# Patient Record
Sex: Male | Born: 1990 | Race: Black or African American | Hispanic: No | Marital: Single | Smoking: Current every day smoker
Health system: Southern US, Community
[De-identification: ages and names within clinical notes are randomized; demographics above are authoritative.]

---
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
## 2012-08-27 ENCOUNTER — Emergency Department (INDEPENDENT_AMBULATORY_CARE_PROVIDER_SITE_OTHER)
Admission: EM | Admit: 2012-08-27 | Discharge: 2012-08-27 | Disposition: A | Discharge status: Home / Self Care | Payer: Self-pay | Source: Home / Self Care | Attending: Family Medicine | Admitting: Family Medicine

## 2012-08-27 ENCOUNTER — Emergency Department (INDEPENDENT_AMBULATORY_CARE_PROVIDER_SITE_OTHER): Payer: Self-pay

## 2012-08-27 ENCOUNTER — Encounter (HOSPITAL_COMMUNITY): Payer: Self-pay | Admitting: Emergency Medicine

## 2012-08-27 DIAGNOSIS — S022XXA Fracture of nasal bones, initial encounter for closed fracture: Secondary | ICD-10-CM
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
## 2013-02-04 ENCOUNTER — Ambulatory Visit: Payer: Self-pay | Admitting: Family Medicine

## 2013-02-04 VITALS — BP 118/62 | HR 75 | Temp 98.2°F | Resp 16 | Ht 74.0 in | Wt 188.0 lb

## 2013-02-04 DIAGNOSIS — M549 Dorsalgia, unspecified: Secondary | ICD-10-CM

## 2013-02-04 MED ORDER — PREDNISONE 20 MG PO TABS: ORAL_TABLET | ORAL | Status: DC
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
## 2013-02-06 ENCOUNTER — Telehealth: Payer: Self-pay

## 2013-02-06 DIAGNOSIS — M549 Dorsalgia, unspecified: Secondary | ICD-10-CM

## 2013-02-06 MED ORDER — PREDNISONE 20 MG PO TABS: ORAL_TABLET | ORAL | Status: DC

## 2013-02-06 NOTE — Telephone Encounter (Signed)
Dr. Lauenstein? 
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
## 2013-02-06 NOTE — Telephone Encounter (Signed)
I have refilled the deltasone Rx as requested

## 2013-02-06 NOTE — Telephone Encounter (Signed)
Spoke with pt, advised Rx sent to pharmacy. 

## 2014-05-29 ENCOUNTER — Encounter (HOSPITAL_COMMUNITY): Payer: Self-pay | Admitting: Emergency Medicine

## 2014-05-29 ENCOUNTER — Emergency Department (HOSPITAL_COMMUNITY)
Admission: EM | Admit: 2014-05-29 | Discharge: 2014-05-29 | Discharge status: Against Medical Advice | Payer: Self-pay | Attending: Emergency Medicine | Admitting: Emergency Medicine

## 2014-05-29 DIAGNOSIS — Y999 Unspecified external cause status: Secondary | ICD-10-CM | POA: Insufficient documentation

## 2014-05-29 DIAGNOSIS — S0990XA Unspecified injury of head, initial encounter: Secondary | ICD-10-CM | POA: Insufficient documentation

## 2014-05-29 DIAGNOSIS — S50311A Abrasion of right elbow, initial encounter: Secondary | ICD-10-CM | POA: Insufficient documentation

## 2014-05-29 DIAGNOSIS — Y929 Unspecified place or not applicable: Secondary | ICD-10-CM | POA: Insufficient documentation

## 2014-05-29 DIAGNOSIS — Y939 Activity, unspecified: Secondary | ICD-10-CM | POA: Insufficient documentation

## 2014-05-29 DIAGNOSIS — S0501XA Injury of conjunctiva and corneal abrasion without foreign body, right eye, initial encounter: Secondary | ICD-10-CM | POA: Insufficient documentation

## 2014-05-29 DIAGNOSIS — Z720 Tobacco use: Secondary | ICD-10-CM | POA: Insufficient documentation

## 2014-05-29 DIAGNOSIS — S40211A Abrasion of right shoulder, initial encounter: Secondary | ICD-10-CM | POA: Insufficient documentation

## 2014-05-29 DIAGNOSIS — R42 Dizziness and giddiness: Secondary | ICD-10-CM | POA: Insufficient documentation

## 2014-05-29 NOTE — ED Notes (Signed)
No answer x3
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
## 2014-05-29 NOTE — ED Notes (Signed)
Call for VS reassessment with no answer.

## 2015-08-27 ENCOUNTER — Ambulatory Visit (INDEPENDENT_AMBULATORY_CARE_PROVIDER_SITE_OTHER): Payer: BC Managed Care – PPO | Admitting: Family Medicine

## 2015-08-27 VITALS — BP 122/70 | HR 92 | Temp 98.6°F | Resp 18 | Ht 74.25 in | Wt 190.6 lb

## 2015-08-27 DIAGNOSIS — J029 Acute pharyngitis, unspecified: Secondary | ICD-10-CM | POA: Diagnosis not present

## 2015-08-27 DIAGNOSIS — D72829 Elevated white blood cell count, unspecified: Secondary | ICD-10-CM

## 2015-08-27 DIAGNOSIS — R1112 Projectile vomiting: Secondary | ICD-10-CM | POA: Diagnosis not present

## 2015-08-27 DIAGNOSIS — R6883 Chills (without fever): Secondary | ICD-10-CM

## 2015-08-27 LAB — POCT CBC
GRANULOCYTE PERCENT: 85.6 % — AB (ref 37–80)
HEMATOCRIT: 40.6 % — AB (ref 43.5–53.7)
Hemoglobin: 14.3 g/dL (ref 14.1–18.1)
LYMPH, POC: 1.3 (ref 0.6–3.4)
MCH, POC: 32.8 pg — AB (ref 27–31.2)
MCHC: 35.2 g/dL (ref 31.8–35.4)
MCV: 93.4 fL (ref 80–97)
MID (CBC): 0.6 (ref 0–0.9)
MPV: 7.2 fL (ref 0–99.8)
POC GRANULOCYTE: 11.5 — AB (ref 2–6.9)
POC LYMPH %: 9.9 % — AB (ref 10–50)
POC MID %: 4.5 %M (ref 0–12)
Platelet Count, POC: 190 10*3/uL (ref 142–424)
RBC: 4.35 M/uL — AB (ref 4.69–6.13)
RDW, POC: 13.1 %
WBC: 13.4 10*3/uL — AB (ref 4.6–10.2)

## 2015-08-27 LAB — POCT RAPID STREP A (OFFICE): Rapid Strep A Screen: NEGATIVE

## 2015-08-27 MED ORDER — ONDANSETRON 4 MG PO TBDP
8.0000 mg | ORAL_TABLET | Freq: Once | ORAL | Status: AC
  Administered 2015-08-27: 8 mg via ORAL

## 2015-08-27 MED ORDER — DOXYCYCLINE HYCLATE 100 MG PO CAPS: 100.0000 mg | ORAL_CAPSULE | Freq: Two times a day (BID) | ORAL | 0 refills | Status: DC

## 2015-08-27 MED ORDER — CEFTRIAXONE SODIUM 250 MG IJ SOLR
250.0000 mg | Freq: Once | INTRAMUSCULAR | Status: AC
  Administered 2015-08-27: 250 mg via INTRAMUSCULAR

## 2015-08-27 MED ORDER — ONDANSETRON HCL 4 MG/2ML IJ SOLN: 2.0000 mg | Freq: Once | INTRAMUSCULAR | Status: DC

## 2015-08-27 MED ORDER — ONDANSETRON 8 MG PO TBDP: 8.0000 mg | ORAL_TABLET | Freq: Three times a day (TID) | ORAL | 0 refills | Status: DC | PRN

## 2015-08-27 NOTE — Patient Instructions (Addendum)
Please return to the office for follow-up tomorrow!   Start Doxycyline today and complete as directed.  If you begin to spike a fever>101.5 or condition worsens, return for care or go to the emergency department.  I will follow-up with your thoart culture.    IF you received an x-ray today, you will receive an invoice from [facility]. Please contact [facility] at [phone] with questions or concerns regarding your invoice.   IF you received labwork today, you will receive an invoice from [facility]. Please contact [facility] at [phone] with questions or concerns regarding your invoice.   Our billing staff will not be able to assist you with questions regarding bills from these companies.  You will be contacted with the lab results as soon as they are available. The fastest way to get your results is to activate your My Chart account. Instructions are located on the last page of this paperwork. If you have not heard from us regarding the results in 2 weeks, please contact this office.      Pharyngitis Pharyngitis is a sore throat (pharynx). There is redness, pain, and swelling of your throat. HOME CARE   Drink enough fluids to keep your pee (urine) clear or pale yellow.  Only take medicine as told by your doctor.  You may get sick again if you do not take medicine as told. Finish your medicines, even if you start to feel better.  Do not take aspirin.  Rest.  Rinse your mouth (gargle) with salt water ( tsp of salt per 1 qt of water) every 1-2 hours. This will help the pain.  If you are not at risk for choking, you can suck on hard candy or sore throat lozenges. GET HELP IF:  You have large, tender lumps on your neck.  You have a rash.  You cough up green, yellow-brown, or bloody spit. GET HELP RIGHT AWAY IF:   You have a stiff neck.  You drool or cannot swallow liquids.  You throw up (vomit) or are not able to keep  medicine or liquids down.  You have very bad pain that does not go away with medicine.  You have problems breathing (not from a stuffy nose). MAKE SURE YOU:   Understand these instructions.  Will watch your condition.  Will get help right away if you are not doing well or get worse.   This information is not intended to replace advice given to you by your health care provider. Make sure you discuss any questions you have with your health care provider.   Document Released: 06/08/2007 Document Revised: 10/10/2012 Document Reviewed: 08/27/2012 Elsevier Interactive Patient Education [employer]2016 Elsevier Inc.

## 2015-08-27 NOTE — Progress Notes (Signed)
Patient ID: Erik Mccormick, male    DOB: [birth year], 25 y.o.   MRN: [id number]  PCP: No PCP Per Patient  Chief Complaint  Patient presents with  . Sore Throat    body chills body aches    Subjective:   HPI Presents for evaluation of sore throat x 4 days.  25 year old male seen today with a complaint of sore throat, chills, fatigue, and decreased appetite. He reports unmeasured fever. Denies headache.  Reports extreme difficulty swallowing even fluids. He has a slight non productive cough and no runny nose.Denies contact with sick person. Projectile vomiting during visit. Reports two prior episodes of vomiting.He reports feeling horrible and shaking with chills constantly.  . Social History   Social History  . Marital status: Single    Spouse name: N/A  . Number of children: N/A  . Years of education: N/A   Occupational History  . Not on file.   Social History Main Topics  . Smoking status: Current Every Day Smoker  . Smokeless tobacco: Not on file  . Alcohol use Yes  . Drug use: No  . Sexual activity: Not on file   Other Topics Concern  . Not on file   Social History Narrative  . No narrative on file     Family History  Problem Relation Age of Onset  . Heart disease Mother   . Hypertension Mother     Review of Systems  Constitutional: Positive for appetite change, chills and fatigue.  HENT: Positive for sore throat and trouble swallowing.   Respiratory: Negative.   Cardiovascular: Negative.   Gastrointestinal: Positive for abdominal pain, nausea and vomiting.    There are no active problems to display for this patient.    Prior to Admission medications   Medication Sig Start Date End Date Taking? Authorizing Provider  predniSONE (DELTASONE) 20 MG tablet 2 daily with food 02/06/13  Yes Elvina SidleKurt Lauenstein, MD   No Known Allergies     Objective:  Physical Exam  Constitutional: He is oriented to person, place, and time. He appears well-developed  and well-nourished.  HENT:  Head: Normocephalic and atraumatic.  Right Ear: External ear normal.  Left Ear: External ear normal.  Tonsils + 3 and uvula inflammed erythematous Exudate left tonsillar wall and posterior pharyngeal wall.  Eyes: Pupils are equal, round, and reactive to light.  Neck: Normal range of motion.  Cardiovascular: Normal rate, regular rhythm and normal heart sounds.   Pulmonary/Chest: Effort normal and breath sounds normal.  Musculoskeletal: Normal range of motion.  Neurological: He is alert and oriented to person, place, and time.  Skin: Skin is warm and dry.  Psychiatric: He has a normal mood and affect. His behavior is normal. Judgment and thought content normal.   Vitals:   08/27/15 1034  BP: 122/70  Pulse: 92  Resp: 18  Temp: 98.6 F (37 C)    Assessment & Plan:  1. Sore throat, visible exudate posterior and lateral pharynx. Tonsils +3 erythematous and shifting uvula to left. Strep vs Gonorrheal infection of throat. - POCT rapid strep A - Gonococcus culture - CefTRIAXone (ROCEPHIN) injection 250 mg; Inject 250 mg into the muscle once. -Doxycyline 100 mg twice daily.  2. Chills - POCT CBC  3. Projectile vomiting with nausea - Ondansetron (ZOFRAN-ODT) disintegrating tablet 8 mg; Take 2 tablets (8 mg total) by mouth once.  4. Leukocytosis - CefTRIAXone (ROCEPHIN) injection 250 mg; Inject 250 mg into the muscle once.  Return to  the office tomorrow for further evaluation.  Godfrey PickKimberly S. Tiburcio PeaHarris, MSN, FNP-C [facility]

## 2015-08-28 ENCOUNTER — Encounter: Payer: Self-pay | Admitting: Family Medicine

## 2015-08-28 ENCOUNTER — Ambulatory Visit (INDEPENDENT_AMBULATORY_CARE_PROVIDER_SITE_OTHER): Payer: BC Managed Care – PPO | Admitting: Family Medicine

## 2015-08-28 VITALS — BP 122/72 | HR 76 | Temp 98.4°F | Resp 17 | Ht 74.5 in | Wt 195.0 lb

## 2015-08-28 DIAGNOSIS — J029 Acute pharyngitis, unspecified: Secondary | ICD-10-CM

## 2015-08-28 NOTE — Progress Notes (Signed)
By signing my name below I, Erik Mccormick, attest that this documentation has been prepared under the direction and in the presence of Erik Sorenson, MD. Electonically Signed. Erik Mccormick, Scribe 08/28/2015 at 4:24 PM  Subjective:    Patient ID: Erik Mccormick, male    DOB: August 09, 1990, 25 y.o.   MRN: [id number]  Chief Complaint  Patient presents with  . Follow-up    sore throat     HPI Erik Mccormick is a 25 y.o. male who presents to the [facility] follow up reevaluation of sore throat. Pt was seen and evaluated yesterday at [facility]. At that time pt had been c/o sore throat for 4 days at that time. Pt was having night time diaphoresis that was improved last night. Pt was also c/o cough, emesis, subjective fever, and chills. Pt was treated with 250 rocephin IM and started on doxycycline. Pt reports that his sore throat is improving. Pt denies any current nausea. Pt states he feels great now. Pt also report that his stomach feels better now as well.  At visit yesterday, pt had negative rapid strep. Gonococcal culture pending. Pt had leukocytosis of 13.4 yesterday as well.   There are no active problems to display for this patient.   Current Outpatient Prescriptions on File Prior to Visit  Medication Sig Dispense Refill  . doxycycline (VIBRAMYCIN) 100 MG capsule Take 1 capsule (100 mg total) by mouth 2 (two) times daily. 20 capsule 0  . ondansetron (ZOFRAN-ODT) 8 MG disintegrating tablet Take 1 tablet (8 mg total) by mouth every 8 (eight) hours as needed for nausea. 16 tablet 0  . predniSONE (DELTASONE) 20 MG tablet 2 daily with food 10 tablet 1   No current facility-administered medications on file prior to visit.     No Known Allergies  Depression screen PHQ 2/9 08/27/2015  Decreased Interest 0  Down, Depressed, Hopeless 0  PHQ - 2 Score 0       Review of Systems  Constitutional: Positive for diaphoresis (improved from 2 nights ago).  HENT: Positive for  sore throat (improving).   Eyes: Negative for visual disturbance.  Respiratory: Negative for cough.   Cardiovascular: Negative for chest pain.  Gastrointestinal: Negative for abdominal pain, nausea and vomiting.  Genitourinary: Negative for dysuria.  Musculoskeletal: Negative for back pain.  Skin: Negative for rash.  Psychiatric/Behavioral: The patient is not nervous/anxious.        Objective:   Physical Exam  Constitutional: He is oriented to person, place, and time. He appears well-developed and well-nourished. No distress.  HENT:  Head: Normocephalic and atraumatic.  Right Ear: Tympanic membrane is erythematous.  Left Ear: Tympanic membrane is erythematous.  Mouth/Throat: Posterior oropharyngeal edema (2+ tonsils with purulent discharge) present.  Nares with some erythema  Eyes: Conjunctivae are normal. Pupils are equal, round, and reactive to light.  Neck: Neck supple.  Cardiovascular: Normal rate, regular rhythm and normal heart sounds.  Exam reveals no gallop and no friction rub.   No murmur heard. Pulmonary/Chest: Effort normal and breath sounds normal. No respiratory distress. He has no decreased breath sounds. He has no wheezes. He has no rhonchi. He has no rales.  Abdominal: Soft. Normal appearance and bowel sounds are normal. There is no hepatosplenomegaly. There is no tenderness. There is no rigidity, no rebound, no guarding and no CVA tenderness.  Musculoskeletal: Normal range of motion.  Neurological: He is alert and oriented to person, place, and time.  Skin: Skin is  warm and dry.  Psychiatric: He has a normal mood and affect. His behavior is normal.  Nursing note and vitals reviewed.    BP 122/72 (BP Location: Right Arm, Patient Position: Sitting, Cuff Size: Normal)   Pulse 76   Temp 98.4 F (36.9 C) (Oral)   Resp 17   Ht 6' 2.5" (1.892 m)   Wt 195 lb (88.5 kg)   SpO2 98%   BMI 24.70 kg/m   Results for orders placed or performed in visit on 08/27/15    POCT rapid strep A  Result Value Ref Range   Rapid Strep A Screen Negative Negative  POCT CBC  Result Value Ref Range   WBC 13.4 (A) 4.6 - 10.2 K/uL   Lymph, poc 1.3 0.6 - 3.4   POC LYMPH PERCENT 9.9 (A) 10 - 50 %L   MID (cbc) 0.6 0 - 0.9   POC MID % 4.5 0 - 12 %M   POC Granulocyte 11.5 (A) 2 - 6.9   Granulocyte percent 85.6 (A) 37 - 80 %G   RBC 4.35 (A) 4.69 - 6.13 M/uL   Hemoglobin 14.3 14.1 - 18.1 g/dL   HCT, POC 16.140.6 (A) 09.643.5 - 53.7 %   MCV 93.4 80 - 97 fL   MCH, POC 32.8 (A) 27 - 31.2 pg   MCHC 35.2 31.8 - 35.4 g/dL   RDW, POC 04.513.1 %   Platelet Count, POC 190 142 - 424 K/uL   MPV 7.2 0 - 99.8 fL       Assessment & Plan:   1. Acute pharyngitis, unspecified etiology   Sig improved. Cont salt water gargles, complete course of doxy  I personally performed the services described in this documentation, which was scribed in my presence. The recorded information has been reviewed and considered, and addended by me as needed.   Erik SorensonEva Audryana Hockenberry, M.D.  [facility]  [place] [address] [place], [place] [address] [phone] phone [phone] fax  08/28/15 10:47 PM

## 2015-08-28 NOTE — Patient Instructions (Addendum)
   IF you received an x-ray today, you will receive an invoice from [facility]. Please contact [facility] at [phone] with questions or concerns regarding your invoice.   IF you received labwork today, you will receive an invoice from [facility]. Please contact [facility] at [phone] with questions or concerns regarding your invoice.   Our billing staff will not be able to assist you with questions regarding bills from these companies.  You will be contacted with the lab results as soon as they are available. The fastest way to get your results is to activate your My Chart account. Instructions are located on the last page of this paperwork. If you have not heard from us regarding the results in 2 weeks, please contact this office.    Pharyngitis Pharyngitis is redness, pain, and swelling (inflammation) of your pharynx.  CAUSES  Pharyngitis is usually caused by infection. Most of the time, these infections are from viruses (viral) and are part of a cold. However, sometimes pharyngitis is caused by bacteria (bacterial). Pharyngitis can also be caused by allergies. Viral pharyngitis may be spread from person to person by coughing, sneezing, and personal items or utensils (cups, forks, spoons, toothbrushes). Bacterial pharyngitis may be spread from person to person by more intimate contact, such as kissing.  SIGNS AND SYMPTOMS  Symptoms of pharyngitis include:   Sore throat.   Tiredness (fatigue).   Low-grade fever.   Headache.  Joint pain and muscle aches.  Skin rashes.  Swollen lymph nodes.  Plaque-like film on throat or tonsils (often seen with bacterial pharyngitis). DIAGNOSIS  Your health care provider will ask you questions about your illness and your symptoms. Your medical history, along with a physical exam, is often all that is needed to diagnose pharyngitis. Sometimes, a rapid strep test is done. Other lab tests may  also be done, depending on the suspected cause.  TREATMENT  Viral pharyngitis will usually get better in 3-4 days without the use of medicine. Bacterial pharyngitis is treated with medicines that kill germs (antibiotics).  HOME CARE INSTRUCTIONS   Drink enough water and fluids to keep your urine clear or pale yellow.   Only take over-the-counter or prescription medicines as directed by your health care provider:   If you are prescribed antibiotics, make sure you finish them even if you start to feel better.   Do not take aspirin.   Get lots of rest.   Gargle with 8 oz of salt water ( tsp of salt per 1 qt of water) as often as every 1-2 hours to soothe your throat.   Throat lozenges (if you are not at risk for choking) or sprays may be used to soothe your throat. SEEK MEDICAL CARE IF:   You have large, tender lumps in your neck.  You have a rash.  You cough up green, yellow-brown, or bloody spit. SEEK IMMEDIATE MEDICAL CARE IF:   Your neck becomes stiff.  You drool or are unable to swallow liquids.  You vomit or are unable to keep medicines or liquids down.  You have severe pain that does not go away with the use of recommended medicines.  You have trouble breathing (not caused by a stuffy nose). MAKE SURE YOU:   Understand these instructions.  Will watch your condition.  Will get help right away if you are not doing well or get worse.   This information is not intended to replace advice given to you by your health care   provider. Make sure you discuss any questions you have with your health care provider.   Document Released: 12/20/2004 Document Revised: 10/10/2012 Document Reviewed: 08/27/2012 Elsevier Interactive Patient Education 2016 Elsevier Inc.  

## 2015-08-30 LAB — GONOCOCCUS CULTURE: Organism ID, Bacteria: NO GROWTH

## 2015-11-13 ENCOUNTER — Ambulatory Visit (INDEPENDENT_AMBULATORY_CARE_PROVIDER_SITE_OTHER): Payer: BC Managed Care – PPO | Admitting: Physician Assistant

## 2015-11-13 VITALS — BP 114/70 | HR 93 | Temp 98.9°F | Resp 16 | Ht 75.0 in | Wt 202.0 lb

## 2015-11-13 DIAGNOSIS — J029 Acute pharyngitis, unspecified: Secondary | ICD-10-CM | POA: Diagnosis not present

## 2015-11-13 DIAGNOSIS — J020 Streptococcal pharyngitis: Secondary | ICD-10-CM

## 2015-11-13 LAB — POCT RAPID STREP A (OFFICE): Rapid Strep A Screen: POSITIVE — AB

## 2015-11-13 MED ORDER — AMOXICILLIN 500 MG PO CAPS: 500.0000 mg | ORAL_CAPSULE | Freq: Two times a day (BID) | ORAL | 0 refills | Status: AC

## 2015-11-13 MED ORDER — AMBULATORY NON FORMULARY MEDICATION: 1 refills | Status: DC

## 2015-11-13 NOTE — Patient Instructions (Addendum)
You tested positive for strep throat. Please take this medication as prescribed. See below for home care instructions. Come back if you are not feeling better in one week.   Thank you for coming in today. I hope you feel we met your needs.  Feel free to call [facility] if you have any questions or further requests.  Please consider signing up for MyChart if you do not already have it, as this is a great way to communicate with me.  Best,  Whitney McVey, PA-C   Strep Throat Strep throat is a bacterial infection of the throat. Your health care provider may call the infection tonsillitis or pharyngitis, depending on whether there is swelling in the tonsils or at the back of the throat. Strep throat is most common during the cold months of the year in children who are 18-18 years of age, but it can happen during any season in people of any age. This infection is spread from person to person (contagious) through coughing, sneezing, or close contact. CAUSES Strep throat is caused by the bacteria called Streptococcus pyogenes. RISK FACTORS This condition is more likely to develop in:  People who spend time in crowded places where the infection can spread easily.  People who have close contact with someone who has strep throat. SYMPTOMS Symptoms of this condition include:  Fever or chills.   Redness, swelling, or pain in the tonsils or throat.  Pain or difficulty when swallowing.  White or yellow spots on the tonsils or throat.  Swollen, tender glands in the neck or under the jaw.  Red rash all over the body (rare). DIAGNOSIS This condition is diagnosed by performing a rapid strep test or by taking a swab of your throat (throat culture test). Results from a rapid strep test are usually ready in a few minutes, but throat culture test results are available after one or two days. TREATMENT This condition is treated with antibiotic medicine. HOME CARE INSTRUCTIONS Medicines  Take  over-the-counter and prescription medicines only as told by your health care provider.  Take your antibiotic as told by your health care provider. Do not stop taking the antibiotic even if you start to feel better.  Have family members who also have a sore throat or fever tested for strep throat. They may need antibiotics if they have the strep infection. Eating and Drinking  Do not share food, drinking cups, or personal items that could cause the infection to spread to other people.  If swallowing is difficult, try eating soft foods until your sore throat feels better.  Drink enough fluid to keep your urine clear or pale yellow. General Instructions  Gargle with a salt-water mixture 3-4 times per day or as needed. To make a salt-water mixture, completely dissolve -1 tsp of salt in 1 cup of warm water.  Make sure that all household members wash their hands well.  Get plenty of rest.  Stay home from school or work until you have been taking antibiotics for 24 hours.  Keep all follow-up visits as told by your health care provider. This is important. SEEK MEDICAL CARE IF:  The glands in your neck continue to get bigger.  You develop a rash, cough, or earache.  You cough up a thick liquid that is green, yellow-brown, or bloody.  You have pain or discomfort that does not get better with medicine.  Your problems seem to be getting worse rather than better.  You have a fever. [place]  IF:  You have new symptoms, such as vomiting, severe headache, stiff or painful neck, chest pain, or shortness of breath.  You have severe throat pain, drooling, or changes in your voice.  You have swelling of the neck, or the skin on the neck becomes red and tender.  You have signs of dehydration, such as fatigue, dry mouth, and decreased urination.  You become increasingly sleepy, or you cannot wake up completely.  Your joints become red or painful.   This information is  not intended to replace advice given to you by your health care provider. Make sure you discuss any questions you have with your health care provider.   Document Released: 12/18/1999 Document Revised: 09/10/2014 Document Reviewed: 04/14/2014 Elsevier Interactive Patient Education 2016 [employer].  IF you received an x-ray today, you will receive an invoice from [facility]. Please contact [facility] at [phone] with questions or concerns regarding your invoice.   IF you received labwork today, you will receive an invoice from [employer]. Please contact [facility] at [phone] with questions or concerns regarding your invoice.   Our billing staff will not be able to assist you with questions regarding bills from these companies.  You will be contacted with the lab results as soon as they are available. The fastest way to get your results is to activate your My Chart account. Instructions are located on the last page of this paperwork. If you have not heard from [place] regarding the results in 2 weeks, please contact this office.

## 2015-11-13 NOTE — Progress Notes (Signed)
Erik Mccormick  MRN: [id number] DOB: January 16, 1990  PCP: No PCP Per Patient  Subjective:  Pt is a 25 year old male presents to clinic for illness x three days.  Treated for pink eye three days ago. Has been taking antibiotic eye drop. Is still taking.  Throat started hurting yesterday, feels a little better today, but still hurts to swallow. Today tonsils are hurting, headache.  Sore throat is described as a sharp pain and is 7/10.  Complains of "getting hot and cold." Taking Mucinex, not helping much.   Treated for strep throat two months ago with antibiotics. Felt 100% better after treatment.   Review of Systems  Constitutional: Positive for chills and fever. Negative for diaphoresis.  HENT: Positive for sore throat and trouble swallowing. Negative for congestion, postnasal drip, rhinorrhea and sinus pain.   Respiratory: Negative for cough, chest tightness, shortness of breath and wheezing.   Cardiovascular: Negative for chest pain, palpitations and leg swelling.  Gastrointestinal: Negative for diarrhea, nausea and vomiting.  Skin: Negative.   Neurological: Negative for dizziness, syncope, light-headedness and headaches.    There are no active problems to display for this patient.   Current Outpatient Prescriptions on File Prior to Visit  Medication Sig Dispense Refill  . doxycycline (VIBRAMYCIN) 100 MG capsule Take 1 capsule (100 mg total) by mouth 2 (two) times daily. (Patient not taking: Reported on 11/13/2015) 20 capsule 0  . ondansetron (ZOFRAN-ODT) 8 MG disintegrating tablet Take 1 tablet (8 mg total) by mouth every 8 (eight) hours as needed for nausea. (Patient not taking: Reported on 11/13/2015) 16 tablet 0  . predniSONE (DELTASONE) 20 MG tablet 2 daily with food (Patient not taking: Reported on 11/13/2015) 10 tablet 1   No current facility-administered medications on file prior to visit.     No Known Allergies   Objective:  BP 114/70   Pulse 93   Temp 98.9 F  (37.2 C) (Oral)   Resp 16   Ht 6\' 3"  (1.905 m)   Wt 202 lb (91.6 kg)   SpO2 96%   BMI 25.25 kg/m   Physical Exam  Constitutional: He is oriented to person, place, and time and well-developed, well-nourished, and in no distress. No distress.  HENT:  Mouth/Throat: Uvula is midline. Mucous membranes are dry. Oropharyngeal exudate, posterior oropharyngeal edema and posterior oropharyngeal erythema present. No tonsillar abscesses.  Cardiovascular: Normal rate, regular rhythm and normal heart sounds.   Pulmonary/Chest: Effort normal. No respiratory distress.  Lymphadenopathy:       Head (right side): Preauricular and posterior auricular adenopathy present.       Head (left side): Preauricular and posterior auricular adenopathy present.  Neurological: He is alert and oriented to person, place, and time. GCS score is 15.  Skin: Skin is warm and dry.  Psychiatric: Mood, memory, affect and judgment normal.  Vitals reviewed.   Results for orders placed or performed in visit on 11/13/15  POCT rapid strep A  Result Value Ref Range   Rapid Strep A Screen Positive (A) Negative    Assessment and Plan :  1. Sore throat 2. Streptococcal sore throat - POCT CBC - POCT rapid strep A - Hurricane cocktail  - Culture, Group A Strep - amoxicillin (AMOXIL) 500 MG capsule; Take 1 capsule (500 mg total) by mouth 2 (two) times daily.  Dispense: 30 capsule; Refill: 0 - Supportive care: Drink plenty of fluids and rest. RTC in one week if not better/symptoms worsen.    Whitney  Yarelli Decelles, PA-C  [facility] 11/13/2015 12:19 PM

## 2016-10-02 ENCOUNTER — Encounter (HOSPITAL_COMMUNITY): Payer: Self-pay | Admitting: Emergency Medicine

## 2016-10-02 ENCOUNTER — Emergency Department (HOSPITAL_COMMUNITY)
Admission: EM | Admit: 2016-10-02 | Discharge: 2016-10-02 | Disposition: A | Discharge status: Home / Self Care | Payer: Self-pay | Attending: Emergency Medicine | Admitting: Emergency Medicine

## 2016-10-02 DIAGNOSIS — F17210 Nicotine dependence, cigarettes, uncomplicated: Secondary | ICD-10-CM | POA: Insufficient documentation

## 2016-10-02 DIAGNOSIS — L609 Nail disorder, unspecified: Secondary | ICD-10-CM

## 2016-10-02 DIAGNOSIS — L600 Ingrowing nail: Secondary | ICD-10-CM | POA: Insufficient documentation

## 2016-10-02 NOTE — ED Notes (Signed)
Bed: WHALB Expected date:  Expected time:  Means of arrival:  Comments: 

## 2016-10-02 NOTE — ED Notes (Signed)
Bed: WTR5 Expected date:  Expected time:  Means of arrival:  Comments: 

## 2016-10-02 NOTE — ED Triage Notes (Signed)
Patient complaining of right big toe pain. Patient states it feels like an ingrown toenail. Patient states when he walks it hurts worse.

## 2016-10-02 NOTE — Discharge Instructions (Signed)
Use a nail file to round out the corner of your nail, this will help with pain. Let your nail grow out and stop clipping it.   Take 600 mg ibuprofen for pain. You can add 1000 mg tylenol for more severe pain.   Wear open toed shoes whenever possible.   Monitor for signs of infection including redness, swelling, yellow drainage, fevers

## 2016-10-02 NOTE — ED Provider Notes (Signed)
[facility]-EMERGENCY DEPT Provider Note   CSN: [id number] Arrival date & time: 10/02/16  0825     History   Chief Complaint Chief Complaint  Patient presents with  . Nail Problem    HPI Erik Mccormick is a 26 y.o. male presents to ED for evaluation of possible ingrown toe nail x past week, worsening this morning. Has been clipping corner of toe nail however but thinks nail is stuck in skin. Worse with wearing closed toed shoes and palpation. No swelling, redness, bleeding, drainage. Has not taken ibuprofen or tylenol for pain.   HPI  No past medical history on file.  There are no active problems to display for this patient.   No past surgical history on file.     Home Medications    Prior to Admission medications   Medication Sig Start Date End Date Taking? Authorizing Provider  AMBULATORY NON FORMULARY MEDICATION MIX: 90 ml Cherry Mylanta, 90 ml Cherry Benadryl, 30 ml Cherry Hurricane liquid.  5-10 ml PO swish and spit or swallow, Q2 hours, prn sore throat. 11/13/15   McVey, Madelaine Bhat, PA-C    Family History Family History  Problem Relation Age of Onset  . Heart disease Mother   . Hypertension Mother     Social History Social History  Substance Use Topics  . Smoking status: Current Every Day Smoker  . Smokeless tobacco: Never Used  . Alcohol use Yes     Allergies   Patient has no known allergies.   Review of Systems Review of Systems  Constitutional: Negative for fever.  Musculoskeletal: Negative for arthralgias.  Skin: Negative for color change.       +nail problem     Physical Exam Updated Vital Signs BP 128/85 (BP Location: Right Arm)   Pulse 65   Temp 98.5 F (36.9 C) (Oral)   Resp 15   Ht  (1.905 m)   Wt 90.7 kg (200 lb)   SpO2 99%   BMI 25.00 kg/m   Physical Exam  Constitutional: He is oriented to person, place, and time. He appears well-developed and well-nourished. No distress.  Talking on the phone during exam. NAD.   HENT:  Head: Normocephalic and atraumatic.  Right Ear: External ear normal.  Left Ear: External ear normal.  Nose: Nose normal.  Eyes: Conjunctivae are normal. No scleral icterus.  Neck: Normal range of motion. Neck supple.  Cardiovascular: Normal rate, regular rhythm, normal heart sounds and intact distal pulses.   No murmur heard. Musculoskeletal: Normal range of motion. He exhibits no deformity.  Neurological: He is alert and oriented to person, place, and time.  Skin: Skin is warm and dry. Capillary refill takes less than 2 seconds.  Lateral corner of right big toe nail has been clipped into a sharp corner, pushing into lateral nail fold, easily lifted off. No surrounding erythema, edema, fluctuance, warmth or drainage.  Psychiatric: He has a normal mood and affect. His behavior is normal. Judgment and thought content normal.  Nursing note and vitals reviewed.    ED Treatments / Results  Labs (all labs ordered are listed, but only abnormal results are displayed) Labs Reviewed - No data to display  EKG  EKG Interpretation None       Radiology No results found.  Procedures Procedures (including critical care time)  Medications Ordered in ED Medications - No data to display   Initial Impression / Assessment and Plan / ED Course  I have reviewed the triage vital signs  and the nursing notes.  Pertinent labs & imaging results that were available during my care of the patient were reviewed by me and considered in my medical decision making (see chart for details).     Exam consistent with ingrown toe nail without infection. Corner of nail easily lifted off. Advised pt to file corner of nail down and allow nail to grow. Discussed signs of infection to monitor for. He verbalized understanding and agreeable to plan. No further indicated for emergent lab work, imaging or procedure today.  Final Clinical Impressions(s) / ED Diagnoses   Final diagnoses:  Nail problem     New Prescriptions Discharge Medication List as of 10/02/2016 10:21 AM       Liberty Handy, PA-C 10/02/16 1413    Lorre Nick, MD 10/03/16 [phone]

## 2017-03-22 ENCOUNTER — Emergency Department (HOSPITAL_COMMUNITY)
Admission: EM | Admit: 2017-03-22 | Discharge: 2017-03-22 | Disposition: A | Discharge status: Home / Self Care | Payer: Self-pay | Attending: Emergency Medicine | Admitting: Emergency Medicine

## 2017-03-22 ENCOUNTER — Encounter (HOSPITAL_COMMUNITY): Payer: Self-pay

## 2017-03-22 ENCOUNTER — Emergency Department (HOSPITAL_COMMUNITY): Payer: Self-pay

## 2017-03-22 DIAGNOSIS — B9789 Other viral agents as the cause of diseases classified elsewhere: Secondary | ICD-10-CM | POA: Insufficient documentation

## 2017-03-22 DIAGNOSIS — J069 Acute upper respiratory infection, unspecified: Secondary | ICD-10-CM | POA: Insufficient documentation

## 2017-03-22 DIAGNOSIS — F17200 Nicotine dependence, unspecified, uncomplicated: Secondary | ICD-10-CM | POA: Insufficient documentation

## 2017-03-22 MED ORDER — BENZONATATE 100 MG PO CAPS: 200.0000 mg | ORAL_CAPSULE | Freq: Three times a day (TID) | ORAL | 0 refills | Status: DC

## 2017-03-22 MED ORDER — SALINE SPRAY 0.65 % NA SOLN: 1.0000 | NASAL | 0 refills | Status: DC | PRN

## 2017-03-22 NOTE — ED Provider Notes (Signed)
[facility] EMERGENCY DEPARTMENT Provider Note   CSN: [id number] Arrival date & time: 03/22/17  0805     History   Chief Complaint No chief complaint on file.   HPI Erik Mccormick is a 27 y.o. male no sniffing a past medical history, who presents to ED for evaluation of 9-day history of initially productive cough with green and yellow sputum which is now dry cough with clear sputum.  Reports that the cough is worse at night.  He also has had symptoms including sore throat, nasal congestion over the 9-day course.  Family members at home with similar symptoms.  He has tried Mucinex and another over-the-counter cold medication with mild improvement in his symptoms.  Denies any hemoptysis, fevers, nausea, vomiting, trouble breathing, trouble swallowing, chest pain.  HPI  History reviewed. No pertinent past medical history.  There are no active problems to display for this patient.   History reviewed. No pertinent surgical history.     Home Medications    Prior to Admission medications   Medication Sig Start Date End Date Taking? Authorizing Provider  AMBULATORY NON FORMULARY MEDICATION MIX: 90 ml Cherry Mylanta, 90 ml Cherry Benadryl, 30 ml Cherry Hurricane liquid.  5-10 ml PO swish and spit or swallow, Q2 hours, prn sore throat. 11/13/15   McVey, Madelaine Bhat, PA-C  benzonatate (TESSALON) 100 MG capsule Take 2 capsules (200 mg total) by mouth every 8 (eight) hours. 03/22/17   Tadhg Eskew, PA-C  sodium chloride (OCEAN) 0.65 % SOLN nasal spray Place 1 spray into both nostrils as needed for congestion. 03/22/17   Dietrich Pates, PA-C    Family History Family History  Problem Relation Age of Onset  . Heart disease Mother   . Hypertension Mother     Social History Social History   Tobacco Use  . Smoking status: Current Every Day Smoker  . Smokeless tobacco: Never Used  Substance Use Topics  . Alcohol use: Yes  . Drug use: Yes    Types: Marijuana       Allergies   Patient has no known allergies.   Review of Systems Review of Systems  Constitutional: Negative for chills and fever.  HENT: Negative for drooling, ear pain, postnasal drip and sore throat.   Respiratory: Positive for cough. Negative for choking, shortness of breath and wheezing.   Cardiovascular: Negative for chest pain and palpitations.  Gastrointestinal: Negative for nausea and vomiting.     Physical Exam Updated Vital Signs BP 128/72 (BP Location: Right Arm)   Pulse 70   Temp 98.7 F (37.1 C) (Oral)   Resp 17   SpO2 100%   Physical Exam  Constitutional: He appears well-developed and well-nourished. No distress.  Nontoxic appearing and in no acute distress.  Speaking complete sentences without difficulty.  HENT:  Head: Normocephalic and atraumatic.  Right Ear: A middle ear effusion is present.  Left Ear: A middle ear effusion is present.  Nose: Nose normal.  Mouth/Throat: Uvula is midline. No trismus in the jaw. No uvula swelling. No posterior oropharyngeal edema or posterior oropharyngeal erythema. No tonsillar exudate.  Patient does not appear to be in acute distress. No trismus or drooling present. No pooling of secretions. Patient is tolerating secretions and is not in respiratory distress. No neck pain or tenderness to palpation of the neck. Full active and passive range of motion of the neck. No evidence of RPA or PTA.  Eyes: Conjunctivae and EOM are normal. No scleral icterus.  Neck:  Normal range of motion.  Cardiovascular: Normal rate, regular rhythm and normal heart sounds.  Pulmonary/Chest: Effort normal and breath sounds normal. No respiratory distress.  Neurological: He is alert.  Skin: No rash noted. He is not diaphoretic.  Psychiatric: He has a normal mood and affect.  Nursing note and vitals reviewed.    ED Treatments / Results  Labs (all labs ordered are listed, but only abnormal results are displayed) Labs Reviewed - No data to  display  EKG  EKG Interpretation None       Radiology Dg Chest 2 View  Result Date: 03/22/2017 CLINICAL DATA:  27 year old male with cough and congestion for 1 week. EXAM: CHEST - 2 VIEW COMPARISON:  05/05/2008. FINDINGS: Mediastinal contours remain normal. Normal lung volumes. Visualized tracheal air column is within normal limits. No pneumothorax, pulmonary edema, pleural effusion or confluent pulmonary opacity. Borderline to mild increased interstitial opacity in both lungs compared to 2010. No osseous abnormality identified. Negative visible bowel gas pattern. IMPRESSION: Negative aside from questionable increased bilateral pulmonary interstitial opacity. Consider acute viral/atypical respiratory infection. Electronically Signed   By: Odessa FlemingH  Hall M.D.   On: 03/22/2017 09:09    Procedures Procedures (including critical care time)  Medications Ordered in ED Medications - No data to display   Initial Impression / Assessment and Plan / ED Course  I have reviewed the triage vital signs and the nursing notes.  Pertinent labs & imaging results that were available during my care of the patient were reviewed by me and considered in my medical decision making (see chart for details).     Patient presents to ED for evaluation of initially productive now dry cough over the past 9 days.  He also reports a constellation of symptoms including sore throat, nasal congestion that have gradually improved over the 9-day course.  Sick contacts at home including mother and sister with similar symptoms.  Here he is afebrile with no recent use of antipyretics.  There is a cough during my examination.  He denies any hemoptysis, shortness of breath, wheezing, vomiting.  Chest x-ray showed questionable viral respiratory infection.  I doubt pneumonia, PE or other infectious cause of symptoms.  Suspect that his symptoms are viral in nature due to the findings on the chest x-ray and history.  Will give Tessalon  Perles, saline nasal spray to help with congestion as needed.  Patient appears stable for discharge at this time.  Strict return precautions given.  Portions of this note were generated with Scientist, clinical (histocompatibility and immunogenetics)Dragon dictation software. Dictation errors may occur despite best attempts at proofreading.   Final Clinical Impressions(s) / ED Diagnoses   Final diagnoses:  Viral URI with cough    ED Discharge Orders        Ordered    benzonatate (TESSALON) 100 MG capsule  Every 8 hours     03/22/17 1004    sodium chloride (OCEAN) 0.65 % SOLN nasal spray  As needed     03/22/17 1004       Dietrich PatesKhatri, Otoniel Myhand, PA-C 03/22/17 1007    Jacalyn LefevreHaviland, Julie, MD 03/22/17 1045

## 2017-03-22 NOTE — ED Triage Notes (Signed)
Patient complains of cough and congestion x 1 week, states that the cough is worse at night

## 2017-08-11 ENCOUNTER — Emergency Department (HOSPITAL_COMMUNITY): Payer: Self-pay

## 2017-08-11 ENCOUNTER — Other Ambulatory Visit: Payer: Self-pay

## 2017-08-11 ENCOUNTER — Emergency Department (HOSPITAL_COMMUNITY)
Admission: EM | Admit: 2017-08-11 | Discharge: 2017-08-11 | Disposition: A | Discharge status: Home / Self Care | Payer: Self-pay | Attending: Emergency Medicine | Admitting: Emergency Medicine

## 2017-08-11 ENCOUNTER — Encounter (HOSPITAL_COMMUNITY): Payer: Self-pay | Admitting: *Deleted

## 2017-08-11 DIAGNOSIS — L02512 Cutaneous abscess of left hand: Secondary | ICD-10-CM | POA: Insufficient documentation

## 2017-08-11 DIAGNOSIS — Z23 Encounter for immunization: Secondary | ICD-10-CM | POA: Insufficient documentation

## 2017-08-11 DIAGNOSIS — F17210 Nicotine dependence, cigarettes, uncomplicated: Secondary | ICD-10-CM | POA: Insufficient documentation

## 2017-08-11 MED ORDER — LIDOCAINE-EPINEPHRINE-TETRACAINE (LET) SOLUTION
3.0000 mL | Freq: Once | NASAL | Status: AC
Start: 2017-08-11 — End: 2017-08-11
  Administered 2017-08-11: 3 mL via TOPICAL
  Filled 2017-08-11: qty 3

## 2017-08-11 MED ORDER — TETANUS-DIPHTH-ACELL PERTUSSIS 5-2.5-18.5 LF-MCG/0.5 IM SUSP
0.5000 mL | Freq: Once | INTRAMUSCULAR | Status: AC
  Administered 2017-08-11: 0.5 mL via INTRAMUSCULAR
  Filled 2017-08-11: qty 0.5

## 2017-08-11 MED ORDER — LIDOCAINE-EPINEPHRINE (PF) 2 %-1:200000 IJ SOLN
10.0000 mL | Freq: Once | INTRAMUSCULAR | Status: AC
  Administered 2017-08-11: 10 mL via INTRADERMAL
  Filled 2017-08-11: qty 20

## 2017-08-11 MED ORDER — AMOXICILLIN-POT CLAVULANATE 875-125 MG PO TABS: 1.0000 | ORAL_TABLET | Freq: Two times a day (BID) | ORAL | 0 refills | Status: AC

## 2017-08-11 MED ORDER — KETOROLAC TROMETHAMINE 30 MG/ML IJ SOLN
30.0000 mg | Freq: Once | INTRAMUSCULAR | Status: AC
  Administered 2017-08-11: 30 mg via INTRAVENOUS
  Filled 2017-08-11: qty 1

## 2017-08-11 MED ORDER — HYDROMORPHONE HCL 1 MG/ML IJ SOLN: 0.5000 mg | Freq: Once | INTRAMUSCULAR | Status: DC

## 2017-08-11 MED ORDER — SODIUM CHLORIDE 0.9 % IV SOLN
3.0000 g | Freq: Once | INTRAVENOUS | Status: AC
  Administered 2017-08-11: 3 g via INTRAVENOUS
  Filled 2017-08-11: qty 3
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
## 2017-08-11 NOTE — Discharge Instructions (Signed)
Please take all of your antibiotics until finished!   You may develop abdominal discomfort or diarrhea from the antibiotic.  You may help offset this with probiotics which you can buy or get in yogurt. Do not eat  or take the probiotics until 2 hours after your antibiotic.   Keep wound clean and dry. Apply warm compresses throughout the day. Alternate 600 mg of ibuprofen and [phone] mg of Tylenol every 3 hours as needed for pain. Do not exceed 4000 mg of Tylenol daily.  Follow-up with Dr. Melvyn Novasrtmann in the office as soon as possible on Monday.  Call his office today to schedule an appointment, let them know that you were seen by his PA in the emergency department and he recommended going to the office on Monday.  Return to emergency department for emergent changing or worsening symptoms such as fevers, vomiting, worsening swelling or streaking.
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
## 2018-06-23 ENCOUNTER — Encounter (HOSPITAL_COMMUNITY): Payer: Self-pay | Admitting: Emergency Medicine

## 2018-06-23 ENCOUNTER — Other Ambulatory Visit: Payer: Self-pay

## 2018-06-23 ENCOUNTER — Ambulatory Visit (HOSPITAL_COMMUNITY)
Admission: EM | Admit: 2018-06-23 | Discharge: 2018-06-23 | Disposition: A | Discharge status: Home / Self Care | Payer: Self-pay | Attending: Internal Medicine | Admitting: Internal Medicine

## 2018-06-23 DIAGNOSIS — K047 Periapical abscess without sinus: Secondary | ICD-10-CM

## 2018-06-23 DIAGNOSIS — K029 Dental caries, unspecified: Secondary | ICD-10-CM

## 2018-06-23 DIAGNOSIS — S025XXA Fracture of tooth (traumatic), initial encounter for closed fracture: Secondary | ICD-10-CM

## 2018-06-23 MED ORDER — AMOXICILLIN 500 MG PO CAPS: 500.0000 mg | ORAL_CAPSULE | Freq: Three times a day (TID) | ORAL | 0 refills | Status: DC

## 2018-06-23 MED ORDER — HYDROCODONE-ACETAMINOPHEN 5-325 MG PO TABS: 1.0000 | ORAL_TABLET | Freq: Four times a day (QID) | ORAL | 0 refills | Status: DC | PRN

## 2018-06-23 NOTE — Discharge Instructions (Signed)
Antibiotic as directed. May take pain medication as needed for severe pain, particularly at night to help you sleep. Follow-up with dentist within 1 week. Return if you develop fever, abscess, difficulty swallowing, facial pain.

## 2018-06-23 NOTE — ED Triage Notes (Signed)
Pt states  He has a toothache.  X 3 days.

## 2018-06-23 NOTE — ED Provider Notes (Signed)
[place]    CSN: [id number] Arrival date & time: 06/23/18  1408     History   Chief Complaint Chief Complaint  Patient presents with   Dental Pain    HPI ELYAS VILLAMOR is a 28 y.o. male presenting for left-sided dental pain.  Patient states that he has had severe pain for the last 2 to 3 days.  Patient denies history of root canal, dental abscess.  Patient does note that he was eating tacos a few days prior and felt a tooth crack.  Patient endorsing pain, worse with chewing food on that side.  Patient has not been able to eat, drink using right-sided mouth to eat.  Patient denies fever, myalgias, eye, ear, nose, throat pain, chest pain, difficulty breathing or swallowing.    History reviewed. No pertinent past medical history.  There are no active problems to display for this patient.   History reviewed. No pertinent surgical history.     Home Medications    Prior to Admission medications   Medication Sig Start Date End Date Taking? Authorizing Provider  AMBULATORY NON FORMULARY MEDICATION MIX: 90 ml Cherry Mylanta, 90 ml Cherry Benadryl, 30 ml Cherry Hurricane liquid.  5-10 ml PO swish and spit or swallow, Q2 hours, prn sore throat. 11/13/15   McVey, Gelene Mink, PA-C  amoxicillin (AMOXIL) 500 MG capsule Take 1 capsule (500 mg total) by mouth 3 (three) times daily. 06/23/18   Hall-Potvin, [place], PA-C  benzonatate (TESSALON) 100 MG capsule Take 2 capsules (200 mg total) by mouth every 8 (eight) hours. 03/22/17   Khatri, Hina, PA-C  HYDROcodone-acetaminophen (NORCO/VICODIN) 5-325 MG tablet Take 1 tablet by mouth every 6 (six) hours as needed. 06/23/18   Hall-Potvin, [place], PA-C  sodium chloride (OCEAN) 0.65 % SOLN nasal spray Place 1 spray into both nostrils as needed for congestion. 03/22/17   Delia Heady, PA-C    Family History Family History  Problem Relation Age of Onset   Heart disease Mother    Hypertension Mother     Social  History Social History   Tobacco Use   Smoking status: Current Every Day Smoker   Smokeless tobacco: Never Used  Substance Use Topics   Alcohol use: Yes   Drug use: Yes    Types: Marijuana     Allergies   Patient has no known allergies.   Review of Systems As per HPI   Physical Exam Triage Vital Signs ED Triage Vitals  Enc Vitals Group     BP 06/23/18 1423 133/80     Pulse Rate 06/23/18 1423 92     Resp 06/23/18 1423 16     Temp 06/23/18 1423 98 F (36.7 C)     Temp Source 06/23/18 1423 Oral     SpO2 06/23/18 1423 98 %     Weight 06/23/18 1420 203 lb (92.1 kg)     Height --      Head Circumference --      Peak Flow --      Pain Score 06/23/18 1420 10     Pain Loc --      Pain Edu? --      Excl. in [place]? --    No data found.  Updated Vital Signs BP 133/80 (BP Location: Right Arm)    Pulse 92    Temp 98 F (36.7 C) (Oral)    Resp 16    Wt 203 lb (92.1 kg)    SpO2 98%    BMI  25.37 kg/m   Visual Acuity Right Eye Distance:   Left Eye Distance:   Bilateral Distance:    Right Eye Near:   Left Eye Near:    Bilateral Near:     Physical Exam Constitutional:      General: He is not in acute distress. HENT:     Head: Normocephalic and atraumatic.     Mouth/Throat:     Mouth: Mucous membranes are moist.     Pharynx: No oropharyngeal exudate or posterior oropharyngeal erythema.     Comments: Poor dentition with numerous dental caries and enamel erosion.  Left lower tooth that is cracked with surrounding erythema and gingival swelling.  Tender to palpation, no surrounding fluctuance appreciated. Eyes:     General: No scleral icterus.    Pupils: Pupils are equal, round, and reactive to light.  Cardiovascular:     Rate and Rhythm: Normal rate.  Pulmonary:     Effort: Pulmonary effort is normal.  Skin:    Coloration: Skin is not jaundiced or pale.  Neurological:     Mental Status: He is alert and oriented to person, place, and time.      UC Treatments  / Results  Labs (all labs ordered are listed, but only abnormal results are displayed) Labs Reviewed - No data to display  EKG None  Radiology No results found.  Procedures Procedures (including critical care time)  Medications Ordered in UC Medications - No data to display  Initial Impression / Assessment and Plan / UC Course  I have reviewed the triage vital signs and the nursing notes.  Pertinent labs & imaging results that were available during my care of the patient were reviewed by me and considered in my medical decision making (see chart for details).     28 year old male presenting for left tooth pain.  Exam consistent with infected tooth, will start antibiotics.  Short-term pain management provided as well.  Patient to follow-up with dentist within 1 week, low cost community dental office numbers provided. Final Clinical Impressions(s) / UC Diagnoses   Final diagnoses:  Closed fracture of tooth, initial encounter  Infected dental caries     Discharge Instructions     Antibiotic as directed. May take pain medication as needed for severe pain, particularly at night to help you sleep. Follow-up with dentist within 1 week. Return if you develop fever, abscess, difficulty swallowing, facial pain.    ED Prescriptions    Medication Sig Dispense Auth. Provider   amoxicillin (AMOXIL) 500 MG capsule Take 1 capsule (500 mg total) by mouth 3 (three) times daily. 21 capsule Hall-Potvin, [place], PA-C   HYDROcodone-acetaminophen (NORCO/VICODIN) 5-325 MG tablet Take 1 tablet by mouth every 6 (six) hours as needed. 8 tablet Hall-Potvin, [place], PA-C     Controlled Substance Prescriptions [place] Controlled Substance Registry consulted? Yes, I have consulted the [place] Controlled Substances Registry for this patient, and feel the risk/benefit ratio today is favorable for proceeding with this prescription for a controlled substance.   Odette FractionHall-Potvin, [place], [place] JerseyPA-C 06/24/18  1916

## 2019-05-03 ENCOUNTER — Ambulatory Visit
Admission: EM | Admit: 2019-05-03 | Discharge: 2019-05-03 | Disposition: A | Discharge status: Home / Self Care | Payer: Self-pay | Attending: Physician Assistant | Admitting: Physician Assistant

## 2019-05-03 DIAGNOSIS — R0981 Nasal congestion: Secondary | ICD-10-CM

## 2019-05-03 DIAGNOSIS — J209 Acute bronchitis, unspecified: Secondary | ICD-10-CM

## 2019-05-03 DIAGNOSIS — R05 Cough: Secondary | ICD-10-CM

## 2019-05-03 DIAGNOSIS — R059 Cough, unspecified: Secondary | ICD-10-CM

## 2019-05-03 MED ORDER — PREDNISONE 50 MG PO TABS: 50.0000 mg | ORAL_TABLET | Freq: Every day | ORAL | 0 refills | Status: AC

## 2019-05-03 MED ORDER — ALBUTEROL SULFATE HFA 108 (90 BASE) MCG/ACT IN AERS
2.0000 | INHALATION_SPRAY | Freq: Once | RESPIRATORY_TRACT | Status: AC
  Administered 2019-05-03: 2 via RESPIRATORY_TRACT

## 2019-05-03 MED ORDER — FLUTICASONE PROPIONATE 50 MCG/ACT NA SUSP: 2.0000 | Freq: Every day | NASAL | 0 refills | Status: AC

## 2019-05-03 NOTE — ED Provider Notes (Signed)
[facility]    CSN: [id number] Arrival date & time: 05/03/19  1452      History   Chief Complaint Chief Complaint  Patient presents with  . Cough    HPI Erik Mccormick is a 29 y.o. male.   29 year old male comes in for 5 day of URI symptoms. Has had cough, congestion. Denies fever, chills, body aches. Denies abdominal pain, nausea, vomiting, diarrhea. Denies shortness of breath, loss of taste/smell. otc cold medicine without relief. Current day smoker.      History reviewed. No pertinent past medical history.  There are no problems to display for this patient.   History reviewed. No pertinent surgical history.     Home Medications    Prior to Admission medications   Medication Sig Start Date End Date Taking? Authorizing Provider  fluticasone (FLONASE) 50 MCG/ACT nasal spray Place 2 sprays into both nostrils daily. 05/03/19   Ok Edwards, PA-C  predniSONE (DELTASONE) 50 MG tablet Take 1 tablet (50 mg total) by mouth daily with breakfast. 05/03/19   Ok Edwards, PA-C    Family History Family History  Problem Relation Age of Onset  . Heart disease Mother   . Hypertension Mother     Social History Social History   Tobacco Use  . Smoking status: Current Every Day Smoker  . Smokeless tobacco: Never Used  Substance Use Topics  . Alcohol use: Yes  . Drug use: Yes    Types: Marijuana     Allergies   Patient has no known allergies.   Review of Systems Review of Systems  Reason unable to perform ROS: See HPI as above.     Physical Exam Triage Vital Signs ED Triage Vitals [05/03/19 1505]  Enc Vitals Group     BP 134/88     Pulse Rate 99     Resp 18     Temp 99.2 F (37.3 C)     Temp Source Oral     SpO2 97 %     Weight      Height      Head Circumference      Peak Flow      Pain Score 0     Pain Loc      Pain Edu?      Excl. in [place]?    No data found.  Updated Vital Signs BP 134/88 (BP Location: Left Arm)   Pulse 99   Temp  99.2 F (37.3 C) (Oral)   Resp 18   SpO2 97%   Physical Exam Constitutional:      General: He is not in acute distress.    Appearance: Normal appearance. He is not ill-appearing, toxic-appearing or diaphoretic.  HENT:     Head: Normocephalic and atraumatic.     Mouth/Throat:     Mouth: Mucous membranes are moist.     Pharynx: Oropharynx is clear. Uvula midline.  Cardiovascular:     Rate and Rhythm: Normal rate and regular rhythm.     Heart sounds: Normal heart sounds. No murmur. No friction rub. No gallop.   Pulmonary:     Effort: Pulmonary effort is normal. No accessory muscle usage, prolonged expiration, respiratory distress or retractions.     Comments: Coughing when talking. Lungs clear to auscultation without adventitious lung sounds. Musculoskeletal:     Cervical back: Normal range of motion and neck supple.  Skin:    General: Skin is warm and dry.  Neurological:  General: No focal deficit present.     Mental Status: He is alert and oriented to person, place, and time.    UC Treatments / Results  Labs (all labs ordered are listed, but only abnormal results are displayed) Labs Reviewed  NOVEL CORONAVIRUS, NAA    EKG   Radiology No results found.  Procedures Procedures (including critical care time)  Medications Ordered in UC Medications  albuterol (VENTOLIN HFA) 108 (90 Base) MCG/ACT inhaler 2 puff (2 puffs Inhalation Given 05/03/19 1545)    Initial Impression / Assessment and Plan / UC Course  I have reviewed the triage vital signs and the nursing notes.  Pertinent labs & imaging results that were available during my care of the patient were reviewed by me and considered in my medical decision making (see chart for details).    COVID PCR test ordered. Patient to quarantine until testing results return. ?bronchospasm causing cough while talking. Albuterol with some relief. Prednisone and other symptomatic treatment discussed. Smoking cessation discussed.  Return precautions given.  Final Clinical Impressions(s) / UC Diagnoses   Final diagnoses:  Cough  Nasal congestion  Acute bronchitis, unspecified organism   ED Prescriptions    Medication Sig Dispense Auth. Provider   predniSONE (DELTASONE) 50 MG tablet Take 1 tablet (50 mg total) by mouth daily with breakfast. 5 tablet Hennessy Bartel V, PA-C   fluticasone (FLONASE) 50 MCG/ACT nasal spray Place 2 sprays into both nostrils daily. 1 g Belinda Fisher, PA-C     PDMP not reviewed this encounter.   Belinda Fisher, PA-C 05/03/19 [phone]

## 2019-05-03 NOTE — ED Triage Notes (Signed)
Pt c/o cough since Monday, c/o mucus stuck in his chest. States has green sputum in the mornings then clear/yellow other times

## 2019-05-03 NOTE — Discharge Instructions (Signed)
COVID PCR testing ordered. I would like you to quarantine until testing results. Prednisone as directed. Flonase for congestion. Albuterol as needed 1-2 puffs every 4-6 hours. Tylenol/motrin for pain and fever. Keep hydrated, urine should be clear to pale yellow in color. If experiencing shortness of breath, trouble breathing, go to the emergency department for further evaluation needed.

## 2019-05-04 LAB — NOVEL CORONAVIRUS, NAA: SARS-CoV-2, NAA: NOT DETECTED

## 2019-05-04 LAB — SARS-COV-2, NAA 2 DAY TAT

## 2019-09-16 ENCOUNTER — Encounter (HOSPITAL_COMMUNITY): Payer: Self-pay

## 2019-09-16 ENCOUNTER — Ambulatory Visit (HOSPITAL_COMMUNITY)
Admission: EM | Admit: 2019-09-16 | Discharge: 2019-09-16 | Disposition: A | Discharge status: Home / Self Care | Payer: Self-pay | Attending: Emergency Medicine | Admitting: Emergency Medicine

## 2019-09-16 ENCOUNTER — Other Ambulatory Visit: Payer: Self-pay

## 2019-09-16 DIAGNOSIS — Z20822 Contact with and (suspected) exposure to covid-19: Secondary | ICD-10-CM | POA: Insufficient documentation

## 2019-09-16 DIAGNOSIS — R112 Nausea with vomiting, unspecified: Secondary | ICD-10-CM | POA: Insufficient documentation

## 2019-09-16 DIAGNOSIS — Z79899 Other long term (current) drug therapy: Secondary | ICD-10-CM | POA: Insufficient documentation

## 2019-09-16 DIAGNOSIS — F17200 Nicotine dependence, unspecified, uncomplicated: Secondary | ICD-10-CM | POA: Insufficient documentation

## 2019-09-16 DIAGNOSIS — Z7952 Long term (current) use of systemic steroids: Secondary | ICD-10-CM | POA: Insufficient documentation

## 2019-09-16 LAB — SARS CORONAVIRUS 2 (TAT 6-24 HRS): SARS Coronavirus 2: NEGATIVE

## 2019-09-16 MED ORDER — ONDANSETRON HCL 4 MG PO TABS: 4.0000 mg | ORAL_TABLET | Freq: Three times a day (TID) | ORAL | 0 refills | Status: AC | PRN

## 2019-09-16 NOTE — Discharge Instructions (Signed)
Small frequent sips of fluids- Pedialyte, Gatorade, water, broth- to maintain hydration.  Zofran every 8 hours as needed for nausea or vomiting.   Self isolate until covid results are back and negative.  Will notify you by phone of any positive findings. Your negative results will be sent through your MyChart.     If symptoms worsen or do not improve in the next week to return to be seen or to follow up with your PCP.   

## 2019-09-16 NOTE — ED Triage Notes (Addendum)
Pt c/o HA, n/v onset Sunday morning. Pt works at [employer] and ate pizza at [employer] on Saturday night. Denies any other employees having similar symptoms after eating pizza Saturday night. Pt admits to smoking marijuana before going to bed.   Pt states n/v, HA have resolved, but feels fatigued, and reports congestion. Denies fever, chills, runny nose, sore throat, ear pain, SOB, cough.  Pt smoked marijuana PTA.

## 2019-09-16 NOTE — ED Provider Notes (Signed)
[facility]    CSN: [id number] Arrival date & time: 09/16/19  0539      History   Chief Complaint Chief Complaint  Patient presents with  . Emesis    HPI Erik Mccormick is a 29 y.o. male.   Erik Mccormick presents with complaints of nausea and vomiting. Two nights ago he ate pizza while at work, he works at [employer]. Woke the next morning and vomited 5-8 times. No blood or black to emesis. Had a migraine as well. Was able to take a nap, when he woke back up his headache had improved. Now overall feels better. Hasn't vomited since yesterday morning. Hasn't eaten this morning. Did eat last night which stayed down. No diarrhea. No abdominal pain currently. No fevers. No URI symptoms. There has been multiple ill coworkers.    ROS per HPI, negative if not otherwise mentioned.       History reviewed. No pertinent past medical history.  There are no problems to display for this patient.   History reviewed. No pertinent surgical history.     Home Medications    Prior to Admission medications   Medication Sig Start Date End Date Taking? Authorizing Provider  fluticasone (FLONASE) 50 MCG/ACT nasal spray Place 2 sprays into both nostrils daily. 05/03/19   Cathie Hoops, Amy V, PA-C  ondansetron (ZOFRAN) 4 MG tablet Take 1 tablet (4 mg total) by mouth every 8 (eight) hours as needed for nausea or vomiting. 09/16/19   Georgetta Haber, NP  predniSONE (DELTASONE) 50 MG tablet Take 1 tablet (50 mg total) by mouth daily with breakfast. 05/03/19   Belinda Fisher, PA-C    Family History Family History  Problem Relation Age of Onset  . Heart disease Mother   . Hypertension Mother     Social History Social History   Tobacco Use  . Smoking status: Current Every Day Smoker  . Smokeless tobacco: Never Used  Vaping Use  . Vaping Use: Never used  Substance Use Topics  . Alcohol use: Yes  . Drug use: Yes    Types: Marijuana     Allergies   Patient has no known  allergies.   Review of Systems Review of Systems   Physical Exam Triage Vital Signs ED Triage Vitals  Enc Vitals Group     BP 09/16/19 0839 128/74     Pulse Rate 09/16/19 0839 83     Resp 09/16/19 0839 18     Temp 09/16/19 0839 98.2 F (36.8 C)     Temp Source 09/16/19 0839 Oral     SpO2 09/16/19 0839 100 %     Weight --      Height --      Head Circumference --      Peak Flow --      Pain Score 09/16/19 0836 0     Pain Loc --      Pain Edu? --      Excl. in [place]? --    No data found.  Updated Vital Signs BP 128/74 (BP Location: Right Arm)   Pulse 83   Temp 98.2 F (36.8 C) (Oral)   Resp 18   SpO2 100%    Physical Exam Constitutional:      Appearance: He is well-developed.  Cardiovascular:     Rate and Rhythm: Normal rate.  Pulmonary:     Effort: Pulmonary effort is normal.  Skin:    General: Skin is warm and dry.  Neurological:  Mental Status: He is alert and oriented to person, place, and time.      UC Treatments / Results  Labs (all labs ordered are listed, but only abnormal results are displayed) Labs Reviewed  SARS CORONAVIRUS 2 (TAT 6-24 HRS)    EKG   Radiology No results found.  Procedures Procedures (including critical care time)  Medications Ordered in UC Medications - No data to display  Initial Impression / Assessment and Plan / UC Course  I have reviewed the triage vital signs and the nursing notes.  Pertinent labs & imaging results that were available during my care of the patient were reviewed by me and considered in my medical decision making (see chart for details).     Non toxic. Benign physical exam.  Tolerating liquids. Ate dinner last night and tolerated. No further emesis. No fevers. Likely viral. covid testing also collected and pending. Supportive cares recommended. Return precautions provided. Patient verbalized understanding and agreeable to plan.   Final Clinical Impressions(s) / UC Diagnoses   Final  diagnoses:  Non-intractable vomiting with nausea, unspecified vomiting type     Discharge Instructions     Small frequent sips of fluids- Pedialyte, Gatorade, water, broth- to maintain hydration.   Zofran every 8 hours as needed for nausea or vomiting.   Self isolate until covid results are back and negative.  Will notify you by phone of any positive findings. Your negative results will be sent through your MyChart.     If symptoms worsen or do not improve in the next week to return to be seen or to follow up with your PCP.      ED Prescriptions    Medication Sig Dispense Auth. Provider   ondansetron (ZOFRAN) 4 MG tablet Take 1 tablet (4 mg total) by mouth every 8 (eight) hours as needed for nausea or vomiting. 10 tablet Georgetta Haber, NP     PDMP not reviewed this encounter.   Georgetta Haber, NP 09/16/19 [phone]

## 2022-08-27 ENCOUNTER — Other Ambulatory Visit: Payer: Self-pay

## 2022-08-27 ENCOUNTER — Emergency Department (HOSPITAL_BASED_OUTPATIENT_CLINIC_OR_DEPARTMENT_OTHER): Payer: Medicaid Other

## 2022-08-27 ENCOUNTER — Encounter (HOSPITAL_BASED_OUTPATIENT_CLINIC_OR_DEPARTMENT_OTHER): Payer: Self-pay

## 2022-08-27 ENCOUNTER — Emergency Department (HOSPITAL_BASED_OUTPATIENT_CLINIC_OR_DEPARTMENT_OTHER)
Admission: EM | Admit: 2022-08-27 | Discharge: 2022-08-27 | Disposition: A | Discharge status: Home / Self Care | Payer: Medicaid Other | Attending: Emergency Medicine | Admitting: Emergency Medicine

## 2022-08-27 DIAGNOSIS — M79671 Pain in right foot: Secondary | ICD-10-CM | POA: Insufficient documentation

## 2022-08-27 MED ORDER — ACETAMINOPHEN 500 MG PO TABS
1000.0000 mg | ORAL_TABLET | Freq: Once | ORAL | Status: AC
  Administered 2022-08-27: 1000 mg via ORAL
  Filled 2022-08-27: qty 2
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
## 2022-08-27 NOTE — Discharge Instructions (Signed)
Please follow-up with her primary care provider regarding recent symptoms and ER visit.  Today your x-rays were negative most likely bruised your foot.  You may use Tylenol every 6 hours needed for pain, ice your foot, keep your foot elevated.  If symptoms change or worsen please return to ER.
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
## 2022-08-27 NOTE — ED Notes (Signed)
Reviewed discharge instructions and recommendations with pt. Pt states understanding. Ambulatory at discharge

## 2023-09-03 ENCOUNTER — Emergency Department (HOSPITAL_COMMUNITY)

## 2023-09-03 ENCOUNTER — Other Ambulatory Visit: Payer: Self-pay

## 2023-09-03 ENCOUNTER — Emergency Department (HOSPITAL_COMMUNITY)
Admission: EM | Admit: 2023-09-03 | Discharge: 2023-09-03 | Disposition: A | Discharge status: Home / Self Care | Attending: Emergency Medicine | Admitting: Emergency Medicine

## 2023-09-03 ENCOUNTER — Encounter (HOSPITAL_COMMUNITY): Payer: Self-pay

## 2023-09-03 DIAGNOSIS — M7022 Olecranon bursitis, left elbow: Secondary | ICD-10-CM | POA: Insufficient documentation

## 2023-09-03 DIAGNOSIS — M25422 Effusion, left elbow: Secondary | ICD-10-CM | POA: Insufficient documentation

## 2023-09-03 DIAGNOSIS — X500XXA Overexertion from strenuous movement or load, initial encounter: Secondary | ICD-10-CM | POA: Insufficient documentation

## 2023-09-03 DIAGNOSIS — Y9389 Activity, other specified: Secondary | ICD-10-CM | POA: Diagnosis not present

## 2023-09-03 DIAGNOSIS — M25522 Pain in left elbow: Secondary | ICD-10-CM | POA: Diagnosis present

## 2023-09-03 MED ORDER — IBUPROFEN 200 MG PO TABS
ORAL_TABLET | ORAL | Status: AC
  Filled 2023-09-03: qty 1

## 2023-09-03 MED ORDER — OXYCODONE-ACETAMINOPHEN 5-325 MG PO TABS: 1.0000 | ORAL_TABLET | Freq: Four times a day (QID) | ORAL | 0 refills | Status: AC | PRN

## 2023-09-03 MED ORDER — IBUPROFEN 600 MG PO TABS: 600.0000 mg | ORAL_TABLET | Freq: Four times a day (QID) | ORAL | 0 refills | Status: AC | PRN

## 2023-09-03 MED ORDER — IBUPROFEN 200 MG PO TABS
600.0000 mg | ORAL_TABLET | Freq: Once | ORAL | Status: DC
  Filled 2023-09-03: qty 1

## 2023-09-03 MED ORDER — OXYCODONE-ACETAMINOPHEN 5-325 MG PO TABS
1.0000 | ORAL_TABLET | Freq: Once | ORAL | Status: AC
  Administered 2023-09-03: 1 via ORAL
  Filled 2023-09-03: qty 1

## 2023-09-03 NOTE — Discharge Instructions (Signed)
 As we discussed, there is swelling in the elbow joint but no fracture identified. Your symptoms suggest bursitis, but if the pain is no better in one week, the xray should be repeated. Take ibuprofen  every 6 hours for pain and inflammation. Take Percocet for severe pain as needed. Ice and elevate the joint to reduce swelling.

## 2023-09-03 NOTE — ED Provider Notes (Signed)
 [place] [facility] Provider Note   CSN: [id number] Arrival date & time: 09/03/23  9177     Patient presents with: Joint Swelling   Erik Mccormick is a 33 y.o. male.   Patient to ED with painful swollen left elbow since last night. No fall or impact type injury. He reports that he has been busy at work which entails repetitive motion, lifting and pulling with the arm.   The history is provided by the patient. No language interpreter was used.       Prior to Admission medications   Medication Sig Start Date End Date Taking? Authorizing Provider  ibuprofen  (ADVIL ) 600 MG tablet Take 1 tablet (600 mg total) by mouth every 6 (six) hours as needed. 09/03/23  Yes Odell Balls, PA-C  oxyCODONE -acetaminophen  (PERCOCET/ROXICET) 5-325 MG tablet Take 1 tablet by mouth every 6 (six) hours as needed for severe pain (pain score 7-10). 09/03/23  Yes Hinton Luellen, PA-C  fluticasone  (FLONASE ) 50 MCG/ACT nasal spray Place 2 sprays into both nostrils daily. 05/03/19   Babara, Amy V, PA-C  ondansetron  (ZOFRAN ) 4 MG tablet Take 1 tablet (4 mg total) by mouth every 8 (eight) hours as needed for nausea or vomiting. 09/16/19   Burky, Natalie B, NP  predniSONE  (DELTASONE ) 50 MG tablet Take 1 tablet (50 mg total) by mouth daily with breakfast. 05/03/19   Babara Greig GAILS, PA-C    Allergies: Patient has no known allergies.    Review of Systems  Updated Vital Signs BP (!) 143/90   Pulse 91   Temp 97.7 F (36.5 C)   Resp (!) 26   Ht 6' 2 (1.88 m)   Wt 113.4 kg   SpO2 97%   BMI 32.10 kg/m   Physical Exam Vitals and nursing note reviewed.  Constitutional:      Appearance: He is well-developed.  Pulmonary:     Effort: Pulmonary effort is normal.  Musculoskeletal:        General: Normal range of motion.     Cervical back: Normal range of motion.     Comments: Left elbow moderately swollen. No warmth or redness. Tender most over posterior joint. There is mild swelling  over the proximal forearm. Distal pulses intact. ROM limited by pain.   Skin:    General: Skin is warm and dry.  Neurological:     Mental Status: He is alert and oriented to person, place, and time.     (all labs ordered are listed, but only abnormal results are displayed) Labs Reviewed - No data to display  EKG: None  Radiology: DG Elbow Complete Left Result Date: 09/03/2023 CLINICAL DATA:  33 year old male with pain and limited range of motion with no specific injury. EXAM: LEFT ELBOW - COMPLETE 3+ VIEW COMPARISON:  None Available. FINDINGS: Four views. Bone mineralization is within normal limits. Maintained joint spaces and alignment. Posterior fat pad is visible, suggesting a small joint effusion. No osseous abnormality identified. Other soft tissue contours within normal limits. IMPRESSION: Evidence of a left elbow joint effusion with no osseous abnormality identified. Electronically Signed   By: VEAR Hurst M.D.   On: 09/03/2023 08:58     Procedures   Medications Ordered in the ED  ibuprofen  (ADVIL ) 200 MG tablet (has no administration in time range)  oxyCODONE -acetaminophen  (PERCOCET/ROXICET) 5-325 MG per tablet 1 tablet (1 tablet Oral Given 09/03/23 0956)  ibuprofen  (ADVIL ) tablet 600 mg (600 mg Oral Given 09/03/23 0956)    Clinical  Course as of 09/03/23 1006  Sun Sep 03, 2023  9040 Patient with left elbow pain after increased repetitive motion at work last night. No impact injury. No fever. He denies IVDA. Xray showing joint effusion. Doubt occult fracture without direct impact. Suspect olecranon bursitis. ACE wrap applied, sling, pain management. Will have the patient follow up for recheck with any persistent pain, recommend re-xray for evaluation of the joint effusion.  [SU]    Clinical Course User Index [SU] Odell Balls, PA-C                                 Medical Decision Making Amount and/or Complexity of Data Reviewed Radiology: ordered.  Risk Prescription drug  management.        Final diagnoses:  Olecranon bursitis of left elbow  Elbow joint effusion, left    ED Discharge Orders          Ordered    oxyCODONE -acetaminophen  (PERCOCET/ROXICET) 5-325 MG tablet  Every 6 hours PRN        09/03/23 1004    ibuprofen  (ADVIL ) 600 MG tablet  Every 6 hours PRN        09/03/23 1004               Odell Balls, PA-C 09/03/23 1006    Elnor Savant A, DO 09/03/23 1252

## 2023-09-03 NOTE — ED Notes (Signed)
 Pt declined ice pack, said he tried that at home and it made it worse.

## 2023-09-03 NOTE — ED Triage Notes (Signed)
 Pt c/o pain in left elbow. Pt denies acute injury, states he feels like he has overworked his elbow. Pt states elbow and arm started swelling yesterday and worsened overnight.

## 2023-12-04 ENCOUNTER — Encounter: Payer: Self-pay | Admitting: Plastic Surgery

## 2023-12-04 ENCOUNTER — Ambulatory Visit: Admitting: Plastic Surgery

## 2023-12-04 VITALS — BP 129/88 | HR 104 | Ht 74.0 in | Wt 251.0 lb

## 2023-12-04 DIAGNOSIS — L910 Hypertrophic scar: Secondary | ICD-10-CM | POA: Insufficient documentation
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
## 2024-03-08 ENCOUNTER — Ambulatory Visit: Admitting: Plastic Surgery
